# Patient Record
Sex: Female | Born: 1980 | Race: White | Hispanic: No | Marital: Married
Health system: Southern US, Community
[De-identification: ages and names within clinical notes are randomized; demographics above are authoritative.]

---

## 2010-02-08 ENCOUNTER — Inpatient Hospital Stay: Payer: Self-pay

## 2012-04-18 ENCOUNTER — Ambulatory Visit: Payer: Self-pay | Admitting: Obstetrics and Gynecology

## 2012-04-18 LAB — COMPREHENSIVE METABOLIC PANEL
BUN: 8 mg/dL (ref 7–18)
Calcium, Total: 8.8 mg/dL (ref 8.5–10.1)
Chloride: 105 mmol/L (ref 98–107)
Co2: 25 mmol/L (ref 21–32)
EGFR (African American): >60
EGFR (Non-African Amer.): >60
SGOT(AST): 27 U/L (ref 15–37)
SGPT (ALT): 20 U/L (ref 12–78)
Sodium: 138 mmol/L (ref 136–145)

## 2012-04-18 LAB — CBC
HCT: 35.3 % (ref 35.0–47.0)
HGB: 12.3 g/dL (ref 12.0–16.0)
MCH: 32.2 pg (ref 26.0–34.0)
MCHC: 34.9 g/dL (ref 32.0–36.0)
MCV: 92 fL (ref 80–100)

## 2012-04-19 ENCOUNTER — Ambulatory Visit: Payer: Self-pay | Admitting: Obstetrics and Gynecology

## 2012-04-21 LAB — PATHOLOGY REPORT

## 2013-04-26 ENCOUNTER — Inpatient Hospital Stay: Payer: Self-pay | Admitting: Obstetrics and Gynecology

## 2013-04-27 LAB — CBC WITH DIFFERENTIAL/PLATELET
Basophil #: 0 10*3/uL (ref 0.0–0.1)
Eosinophil %: 0.4 %
HCT: 27.6 % — ABNORMAL LOW (ref 35.0–47.0)
Lymphocyte %: 12.1 %
MCHC: 36.4 g/dL — ABNORMAL HIGH (ref 32.0–36.0)
MCV: 91 fL (ref 80–100)
Monocyte #: 1 x10 3/mm — ABNORMAL HIGH (ref 0.2–0.9)
Neutrophil %: 80.8 %
Platelet: 126 10*3/uL — ABNORMAL LOW (ref 150–440)
RBC: 3.02 10*6/uL — ABNORMAL LOW (ref 3.80–5.20)
RDW: 13 % (ref 11.5–14.5)

## 2013-06-04 ENCOUNTER — Emergency Department: Payer: Self-pay | Admitting: Emergency Medicine

## 2013-06-04 LAB — COMPREHENSIVE METABOLIC PANEL
Alkaline Phosphatase: 95 U/L
Bilirubin,Total: 0.6 mg/dL (ref 0.2–1.0)
Chloride: 104 mmol/L (ref 98–107)
Co2: 26 mmol/L (ref 21–32)
Creatinine: 0.77 mg/dL (ref 0.60–1.30)
EGFR (Non-African Amer.): >60
Glucose: 86 mg/dL (ref 65–99)
Osmolality: 281 (ref 275–301)
Potassium: 3.4 mmol/L — ABNORMAL LOW (ref 3.5–5.1)
SGOT(AST): 15 U/L (ref 15–37)
SGPT (ALT): 11 U/L — ABNORMAL LOW (ref 12–78)
Sodium: 142 mmol/L (ref 136–145)

## 2013-06-04 LAB — CBC
HCT: 40.1 % (ref 35.0–47.0)
MCHC: 33.6 g/dL (ref 32.0–36.0)
MCV: 92 fL (ref 80–100)
Platelet: 174 10*3/uL (ref 150–440)
RBC: 4.35 10*6/uL (ref 3.80–5.20)
RDW: 12.8 % (ref 11.5–14.5)
WBC: 6.3 10*3/uL (ref 3.6–11.0)

## 2013-06-04 LAB — URINALYSIS, COMPLETE
Bilirubin,UR: NEGATIVE
Glucose,UR: NEGATIVE mg/dL (ref 0–75)
Ketone: NEGATIVE
Nitrite: NEGATIVE
RBC,UR: 1 /HPF (ref 0–5)
Squamous Epithelial: NONE SEEN
WBC UR: 3 /HPF (ref 0–5)

## 2013-06-04 LAB — CK TOTAL AND CKMB (NOT AT ARMC): CK-MB: <0.5 ng/mL — ABNORMAL LOW (ref 0.5–3.6)

## 2013-12-13 ENCOUNTER — Ambulatory Visit: Payer: Self-pay | Admitting: Internal Medicine

## 2013-12-27 IMAGING — CR DG CHEST 1V PORT
1 series · 1 of 1 positions shown · non-contrast
Comparison: No priors.

CLINICAL DATA: Shortness of breath. Postpartum patient (1 month
ago).

EXAM:
PORTABLE CHEST - 1 VIEW

[ap]
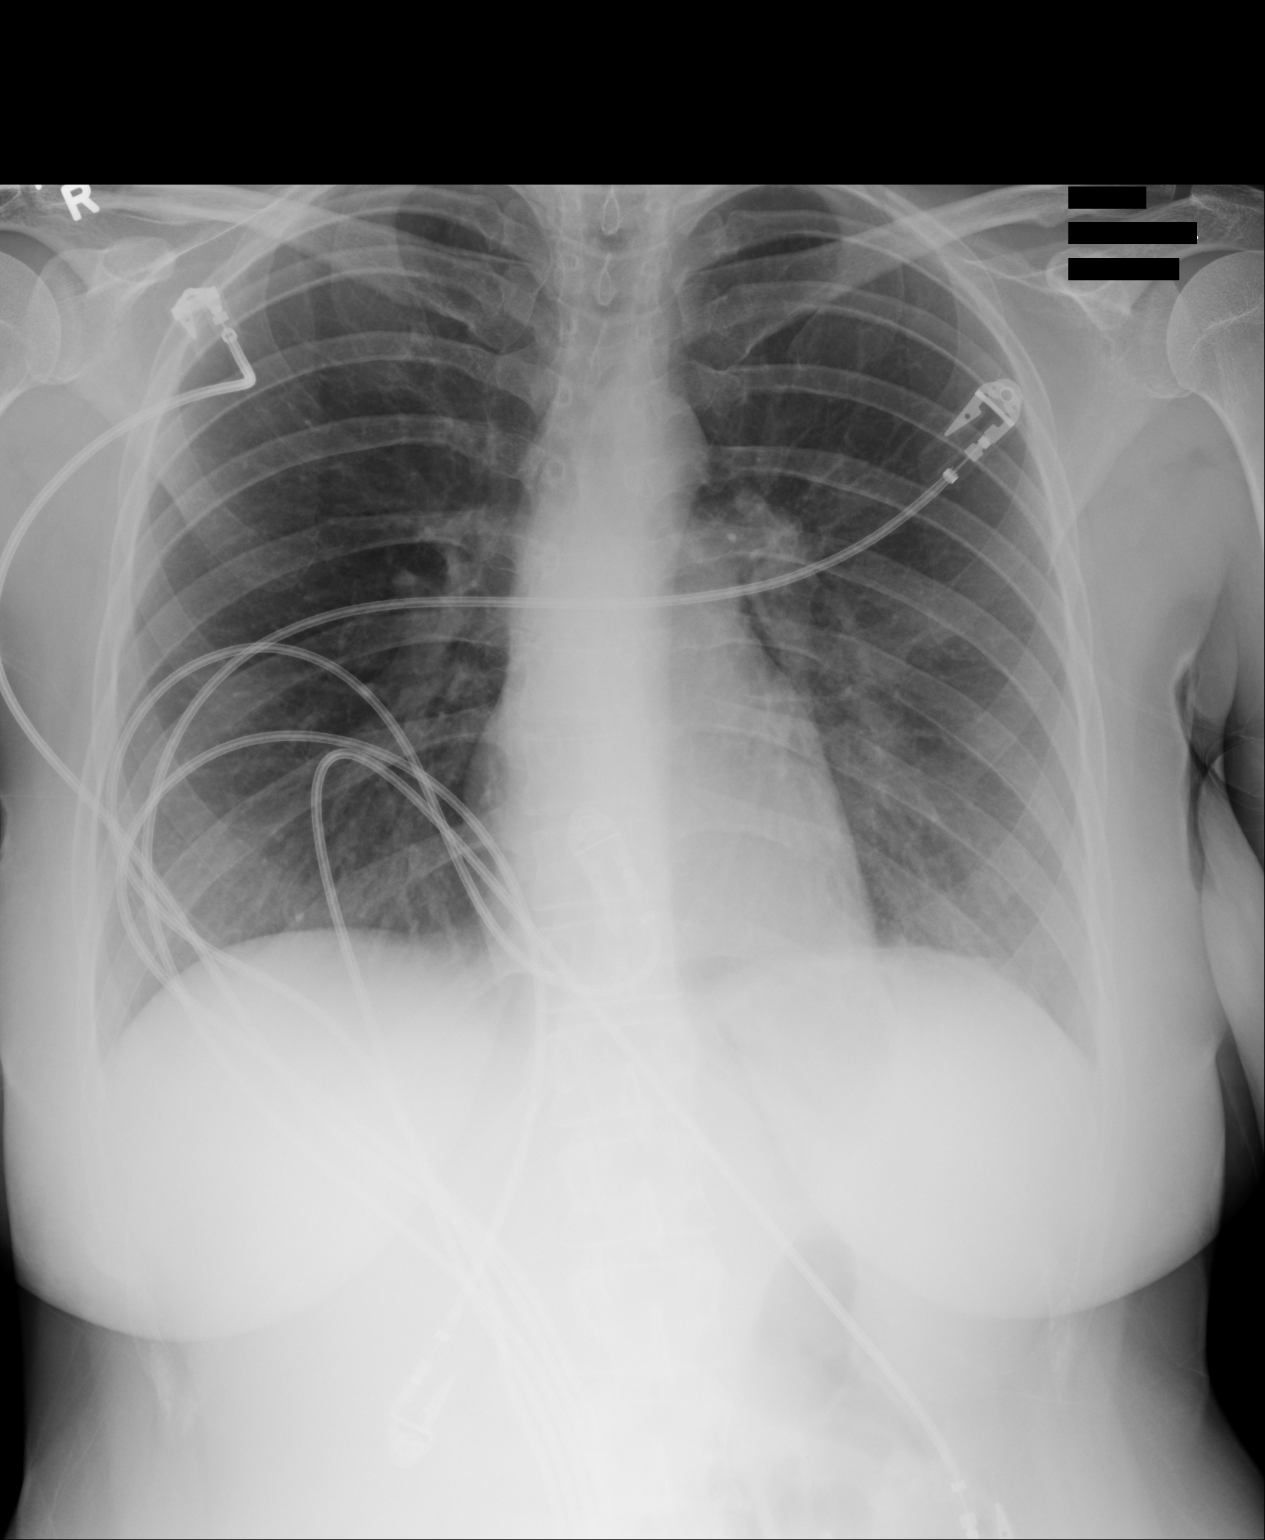

[1 of 1 positions shown; findings below may reference images not displayed]

FINDINGS: Lung volumes are normal. No consolidative airspace disease. No
pleural effusions. No pneumothorax. No pulmonary nodule or mass
noted. Pulmonary vasculature and the cardiomediastinal silhouette
are within normal limits.
IMPRESSION: 1.  No radiographic evidence of acute cardiopulmonary disease.

## 2014-05-21 ENCOUNTER — Ambulatory Visit: Payer: Self-pay | Admitting: Family Medicine

## 2014-10-30 NOTE — Op Note (Signed)
PATIENT NAME:  [name], [name] MR#:  [id number] DATE OF BIRTH:  11/23/1980  DATE OF PROCEDURE:  04/19/2012  PREOPERATIVE DIAGNOSIS: Missed abortion.   POSTOPERATIVE DIAGNOSIS: Missed abortion.   PROCEDURE: Suction D and C.   ANESTHESIA: General.   SURGEON: Stephen D. Jackson, MD   ESTIMATED BLOOD LOSS: 500 mL.  OPERATIVE FLUIDS: 1000 mL Crystalloid.   COMPLICATIONS: None.   FINDINGS: 10 week size uterus.   SPECIMENS: Endometrial curettings.   CONDITION AT THE END OF THE PROCEDURE: Stable.   INDICATIONS FOR PROCEDURE: Ms. Rachel Boyd is a 34-year-old female gravida 2 para 1-0-1-1 who was recently diagnosed with a spontaneous missed abortion. After counseling of the multiple ways of managing the missed abortion, she elected to go to the operating room to undergo suction D and C.   PROCEDURE IN DETAIL: The patient was met in the preoperative area and her questions were answered and the procedure was reviewed. She was taken to the operating room and placed under general anesthesia which was found to be adequate. She was placed in the dorsal supine high lithotomy position and prepped and draped in the usual sterile fashion. After time-out was called, a sterile speculum was placed in the vagina and a single-tooth tenaculum was used to grasp the anterior lip of the cervix. Uterus was sounded to a depth of 11 cm. The cervix was then gently dilated in a serial fashion using Hegar dilators to a dilatation of 11 mm. A #10 suction curette was then passed through the cervix gently and a suction curettage was performed for two passes with good return of products of conception likely. A sharp curette was then used in the uterus until a gritty texture was noted throughout. A single final pass of the suction curette was then made. This completed the procedure. There was some bleeding on the cervix and gentle pressure was held against the cervix until the bleeding stopped. This was monitored for a period of 15  minutes and the patient was found to be hemostatic. The single-tooth tenaculum was removed from the anterior lip of the cervix and silver nitrate was applied to the cervix to obtain hemostasis. After observing the patient for an additional five minutes to ensure that no additional bleeding was occurring from the cervix, the procedure was then terminated as she was hemostatic.   The patient tolerated the procedure well. Sponge, lap, and instrument counts were correct x2. She received for antibiotics 100 mg of doxycycline IV. For VTE prophylaxis the patient had on TED hose as well as SCDs which were on and operating throughout the entire procedure. She was awakened in the operating room and taken to the recovery area in stable condition.   ____________________________ Stephen D. Jackson, MD sdj:drc D: 04/19/2012 13:28:24 ET T: 04/19/2012 13:39:05 ET JOB#: [id number]  cc: Stephen D. Jackson, MD, <Dictator> STEPHEN D JACKSON MD ELECTRONICALLY SIGNED 05/18/2012 4:41 

## 2014-11-06 IMAGING — CT CT ANGIO CHEST
2 of 6 series · 18 of 36 positions shown · IV contrast (APPLIED)
Comparison: No priors.  Chest x-ray 06/04/2013.

CLINICAL DATA: Sharp left-sided chest pain.  One month postpartum.

EXAM:
CT ANGIOGRAPHY CHEST WITH CONTRAST
TECHNIQUE: Multidetector CT imaging of the chest was performed using the
standard protocol during bolus administration of intravenous
contrast. Multiplanar CT image reconstructions including MIPs were
obtained to evaluate the vascular anatomy.
CONTRAST:  100 mL of Isovue 370.

[Series 5: pe 1.0 thins · axial · 0.68mm/px · z∈[+166,+366]mm · 17 of 226 slices shown]
[im 13/226  lung]
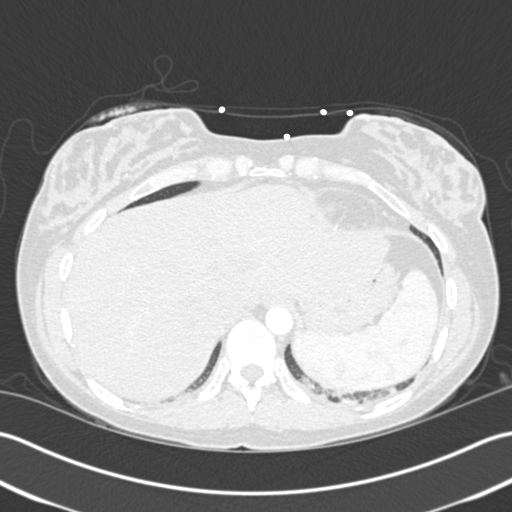
[im 26/226  mediastinal]
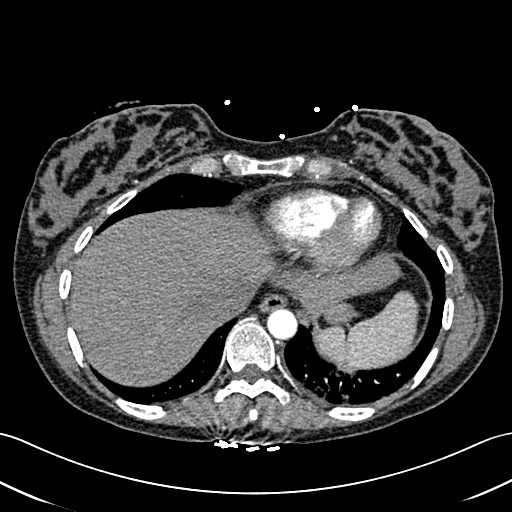
[im 38/226  lung]
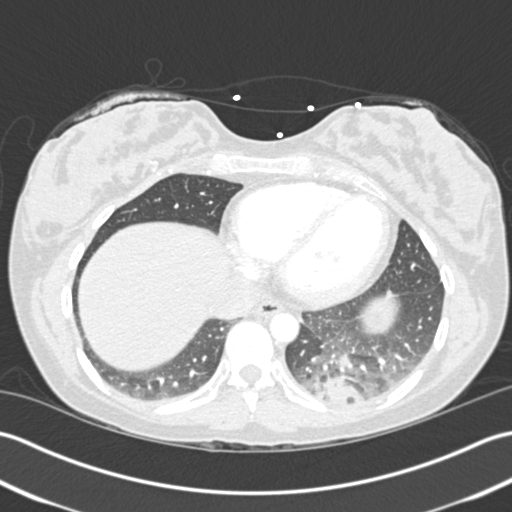
[im 51/226  mediastinal]
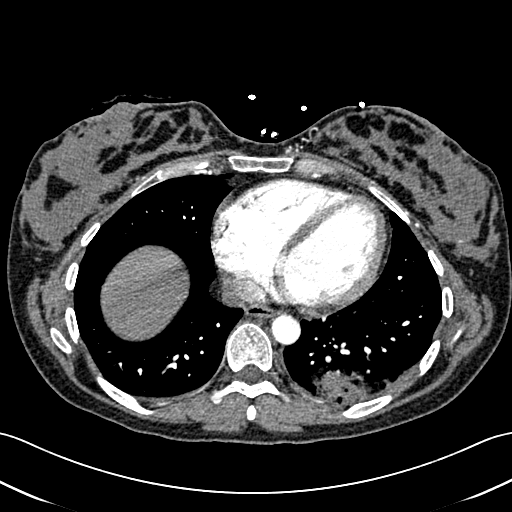
[im 63/226  lung]
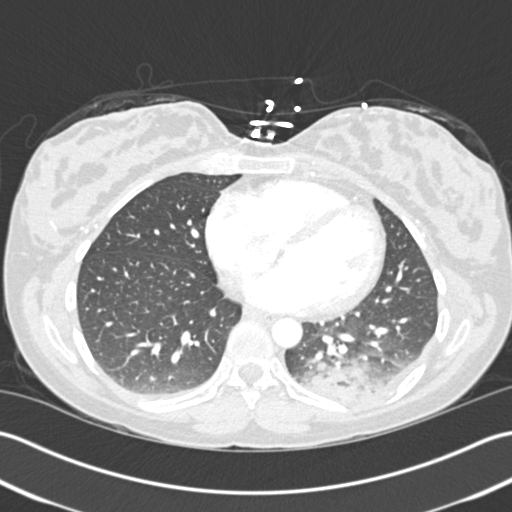
[im 76/226  mediastinal]
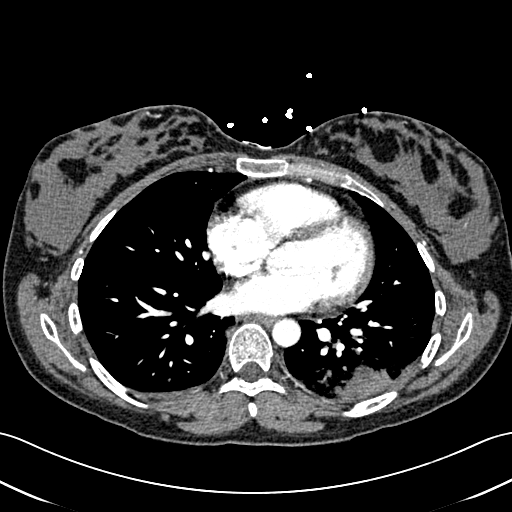
[im 88/226  lung]
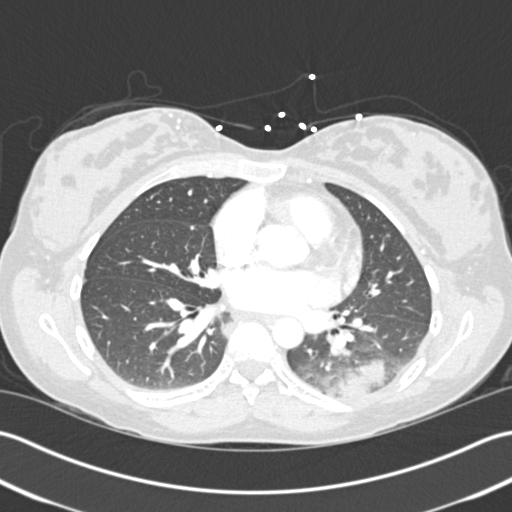
[im 101/226  mediastinal]
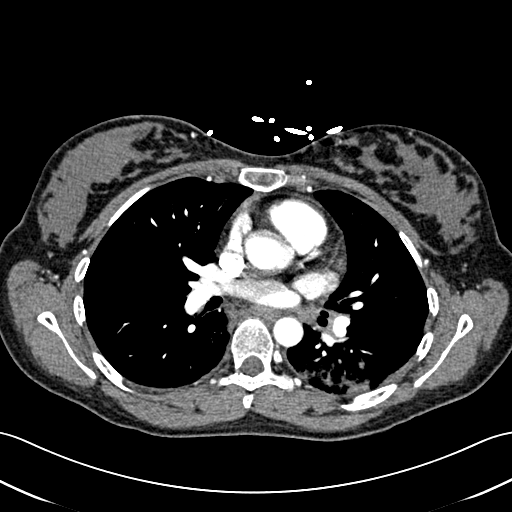
[im 113/226  lung]
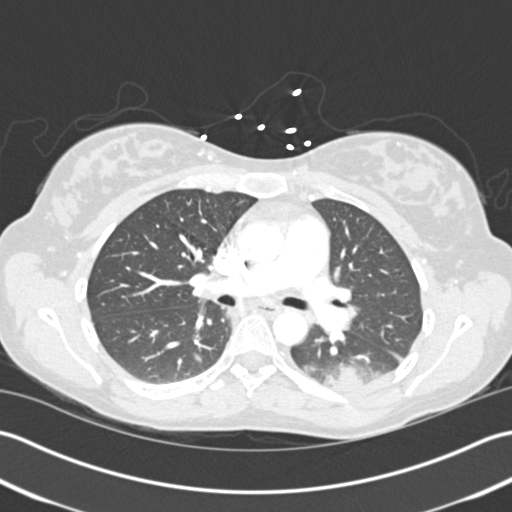
[im 126/226  mediastinal]
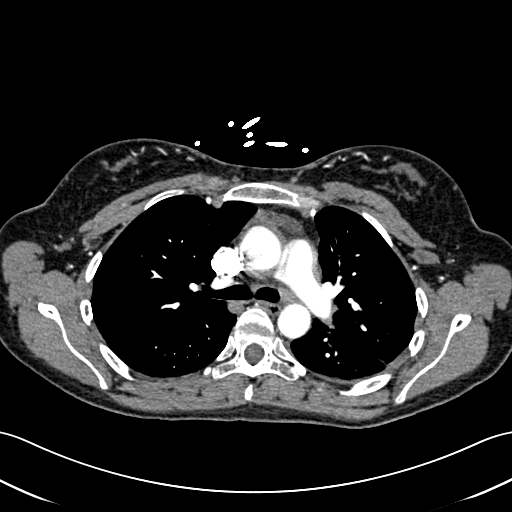
[im 138/226  lung]
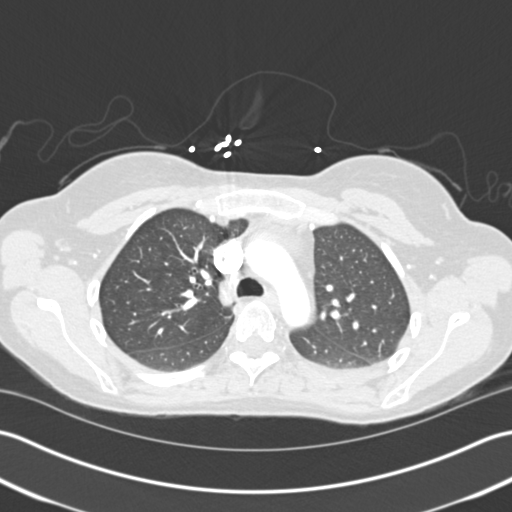
[im 151/226  mediastinal]
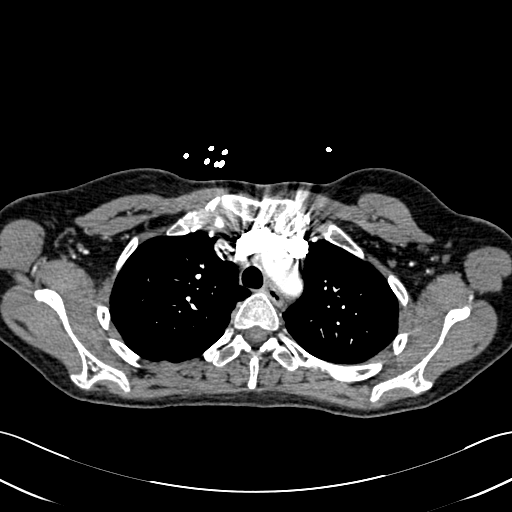
[im 163/226  lung]
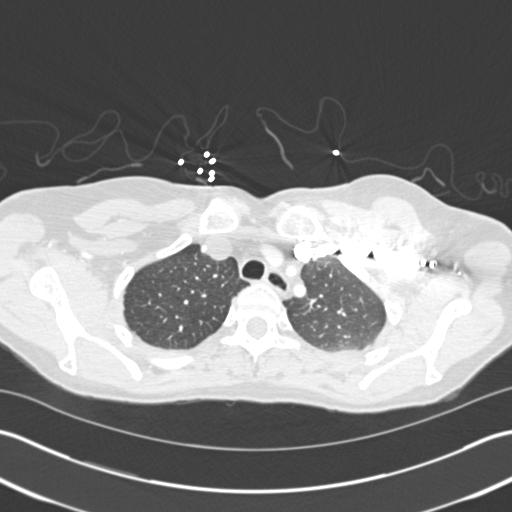
[im 176/226  mediastinal]
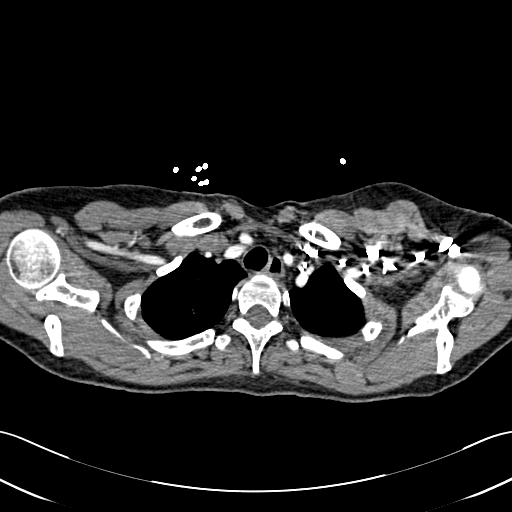
[im 188/226  lung]
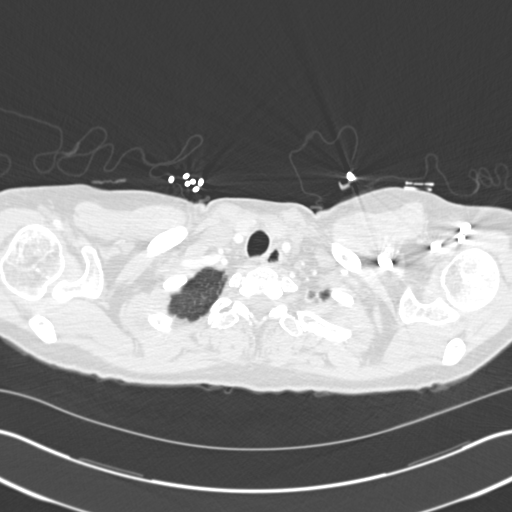
[im 201/226  mediastinal]
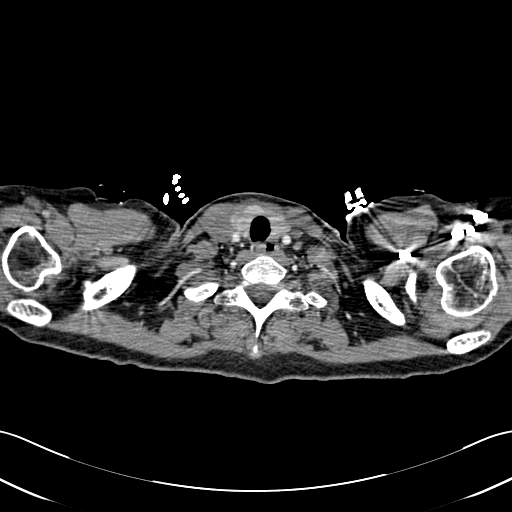
[im 213/226  lung]
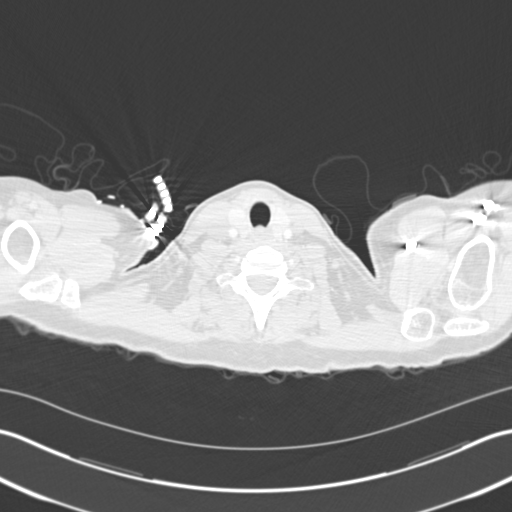

[Series 7: cor pe 2.0 mpr · coronal · 0.43mm/px · 1 of 105 slices shown]
[im 53/105  mediastinal]
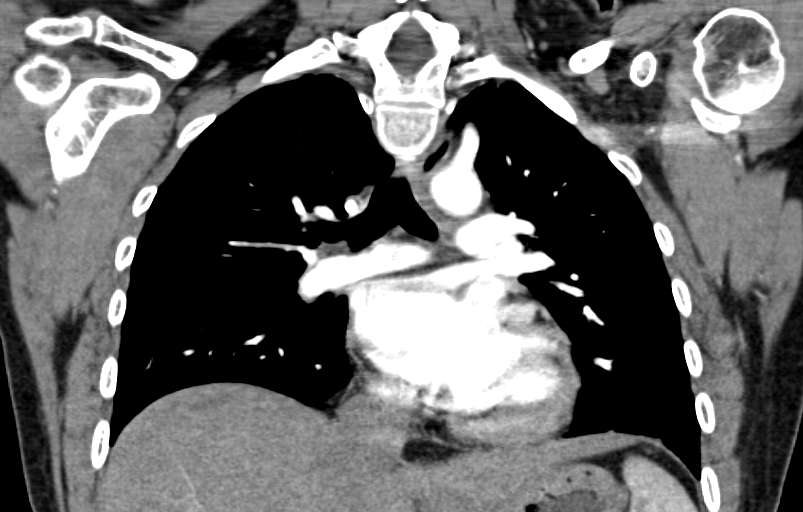

[18 of 36 positions shown; findings below may reference images not displayed]

FINDINGS: Mediastinum: No filling defects within the pulmonary arterial tree
to suggest underlying pulmonary embolism. Heart size is normal.
There is no significant pericardial fluid, thickening or pericardial
calcification. No pathologically enlarged mediastinal or hilar lymph
nodes. Numerous borderline enlarged left hilar lymph nodes,
presumably reactive. Esophagus is unremarkable in appearance.

Lungs/Pleura: Extensive multifocal airspace consolidation throughout
the left lower lobe, compatible with bronchopneumonia. This is
predominantly dependent located posteriorly within left lower lobe,
not in contact with the superior aspect of the left hemidiaphragm,
accounting for lack of obscuration of the diaphragm on the recent
chest radiograph. There is also a tiny focus of consolidation in the
medial aspect of the right lower lobe is well best demonstrated on
image 74 of series 6, which could suggest some early endobronchial
spread of infection. The airspace consolidation in the left lower
lobe makes contact with the overlying pleura, likely accounting for
the patient's sharp left-sided chest pain. No significant pleural
effusion is identified at this time. No pneumothorax.

Upper Abdomen: Unremarkable.

Musculoskeletal: There are no aggressive appearing lytic or blastic
lesions noted in the visualized portions of the skeleton.

Review of the MIP images confirms the above findings.
IMPRESSION: 1. No evidence of pulmonary embolism.
2. Severe left lower lobe bronchopneumonia, predominantly in the
dependent portion of the left lower lobe. Given the dependent
location of this pneumonia, this could represent an aspiration
pneumonia.
3. Small focus of airspace consolidation in the medial aspect of the
right lower lobe is well suggests some early endobronchial spread of
infection.
These results were called by telephone at the time of interpretation
on 06/04/2013 at [DATE] to Dr. KAI UWE RAUAN, who verbally
acknowledged these results.

## 2014-11-20 NOTE — H&P (Signed)
L&D Evaluation:  History:  HPI 34 year old WF, G3 P1011 with an EDC=04/23/2013 by LMP=07/17/2012 and a 6wk1day ultrasound presents at 40 3/7 weeks with c/o LOF and painful contractions. Has been noticing mucoid discharge since cx exam yesterday which started to look more watery around 1200 and particularly after 1745 when the contractions became more intense and frequent. [facility] VB[facility] first [facility] and has been remarkable for a normal anatomy scan, mild thrombocytopenia (last plt count was 145K on 10/1), and anemia which was corrected with FE supplementation. Received TDAP 7/30.  LABS: O POS, RI, VI, GBS positive. NST reactive and AFI= 9.3 cm on 10/14. Past HX of a SVD7/30/2011 of a 7#11oz female after IOL at 40 4/7 weeks for oligo.   Presents with contractions, leaking fluid   Patient's Medical History migraine   Patient's Surgical History dx lap-removed cyst from Fallopian tube 2002   Medications Pre Natal Vitamins  Fioricet prn   Allergies NKDA   Social History none   Family History Non-Contributory   ROS:  ROS All systems were reviewed.  HEENT, CNS, GI, GU, Respiratory, CV, Renal and Musculoskeletal systems were found to be normal.   Exam:  Vital Signs stable   Urine Protein not completed   General breathing with contractions   Mental Status clear   Chest clear   Heart normal sinus rhythm, no murmur/gallop/rubs   Abdomen gravid, tender with contractions   Estimated Fetal Weight Average for gestational age   Fetal Position cephalic   Edema no edema   Reflexes 2+   Pelvic clear fluid around clitoris, SSE: positive fern, pooling of clear fluid, Nitrazine. CX: 4.5/90%/-1   Mebranes Ruptured   Description clear   FHT normal rate with no decels, 125 baseline with accels to 150, mod variability   FHT Description CAt 1   Ucx q1-2, inconsistent strength and duration   Skin dry   Impression:  Impression IUP at 40 3/7 weeks with SROM in early  labor   Plan:  Plan EFM/NST, monitor contractions and for cervical change, antibiotics for GBBS prophylaxis, Epidural after admitted.   Electronic Signatures: Trinna BalloonGutierrez, [place] L (CNM)  (Signed 15-Oct-14 20:38)  Authored: L&D Evaluation   Last Updated: 15-Oct-14 20:38 by Trinna BalloonGutierrez, Cyntia Staley L (CNM)

## 2022-09-07 ENCOUNTER — Encounter: Payer: Self-pay | Admitting: Family Medicine

## 2022-09-07 DIAGNOSIS — N6311 Unspecified lump in the right breast, upper outer quadrant: Secondary | ICD-10-CM

## 2022-09-08 ENCOUNTER — Other Ambulatory Visit: Payer: Self-pay | Admitting: Family Medicine

## 2022-09-08 DIAGNOSIS — N63 Unspecified lump in unspecified breast: Secondary | ICD-10-CM

## 2022-09-08 DIAGNOSIS — Z1231 Encounter for screening mammogram for malignant neoplasm of breast: Secondary | ICD-10-CM

## 2022-09-11 ENCOUNTER — Ambulatory Visit
Admission: RE | Admit: 2022-09-11 | Discharge: 2022-09-11 | Disposition: A | Discharge status: Home / Self Care | Payer: BC Managed Care – PPO | Source: Ambulatory Visit | Attending: Family Medicine | Admitting: Family Medicine

## 2022-09-11 DIAGNOSIS — Z1231 Encounter for screening mammogram for malignant neoplasm of breast: Secondary | ICD-10-CM

## 2022-09-11 DIAGNOSIS — N63 Unspecified lump in unspecified breast: Secondary | ICD-10-CM
